# Patient Record
Sex: Female | Born: 1995 | Race: White | Hispanic: No | Marital: Single
Health system: Southern US, Community
[De-identification: ages and names within clinical notes are randomized; demographics above are authoritative.]

## PROBLEM LIST (undated history)

## (undated) DIAGNOSIS — Z9889 Other specified postprocedural states: Secondary | ICD-10-CM

## (undated) DIAGNOSIS — M8560 Other cyst of bone, unspecified site: Secondary | ICD-10-CM

## (undated) HISTORY — PX: SP BONE MARROW BX: HXRAD666

---
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
## 2018-06-07 IMAGING — DX DG KNEE COMPLETE 4+V*L*
4 series · 4 of 4 positions shown · non-contrast
Comparison: None.

CLINICAL DATA: Pain and stiffness for 3 days. Reported history of
bone cysts.

EXAM:
LEFT KNEE - COMPLETE 4+ VIEW

[knee ap]
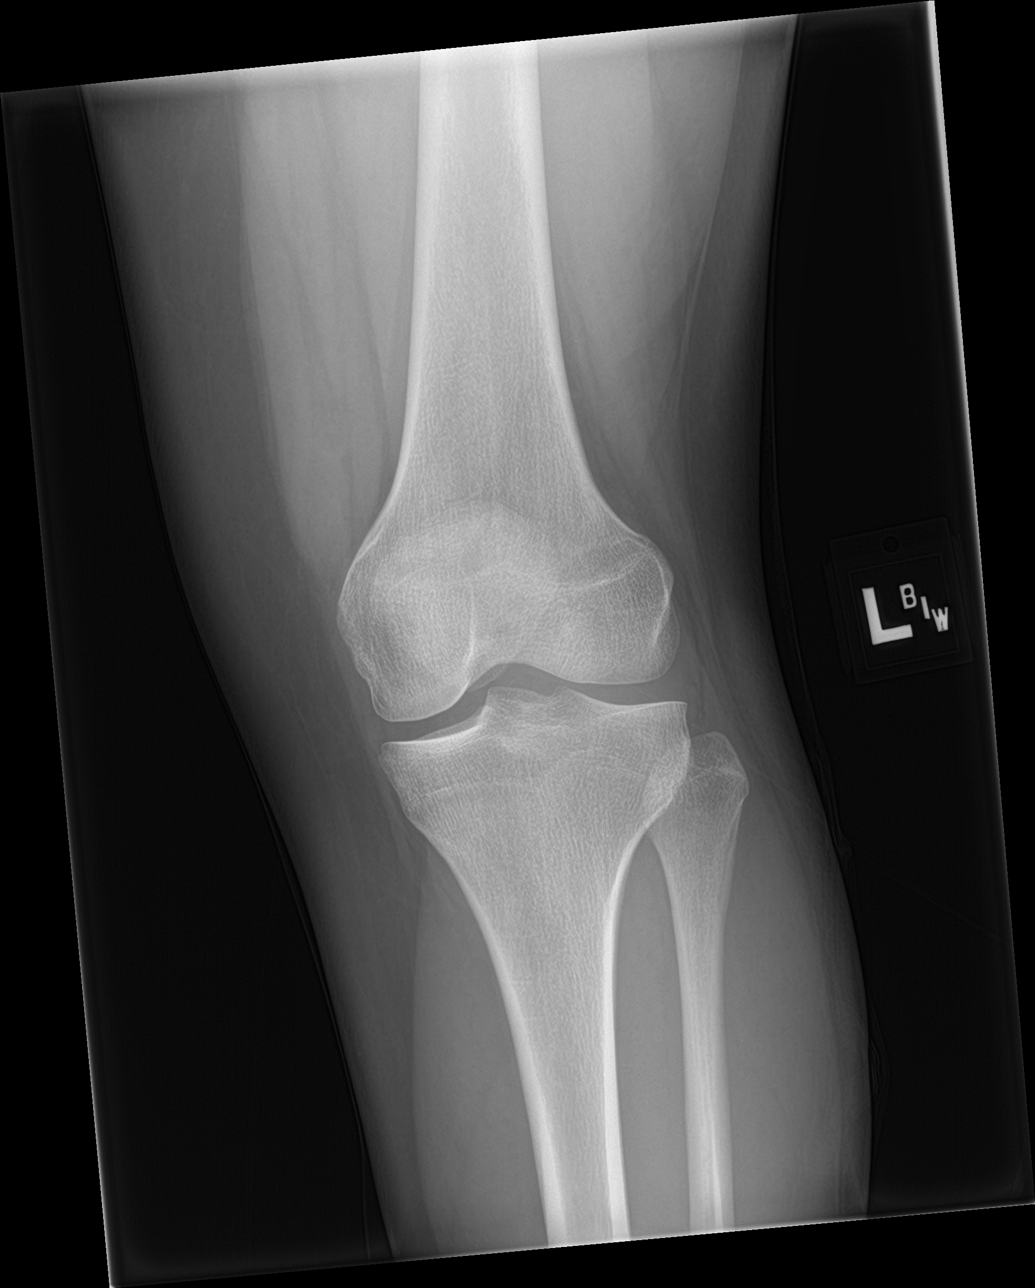

[knee lat]
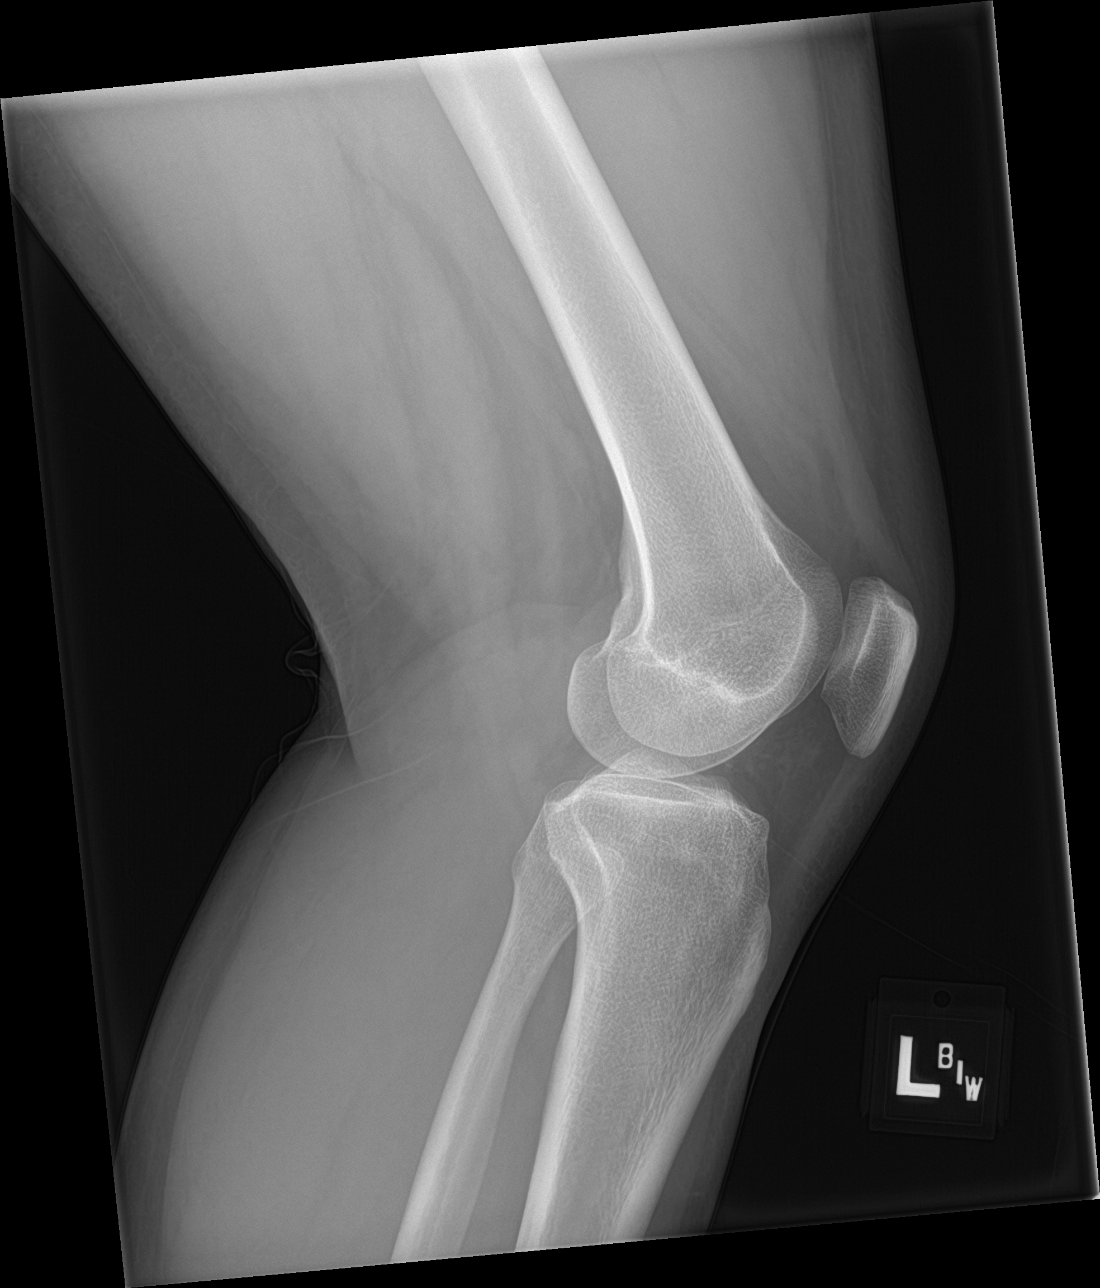

[knee obl (1 of 2)]
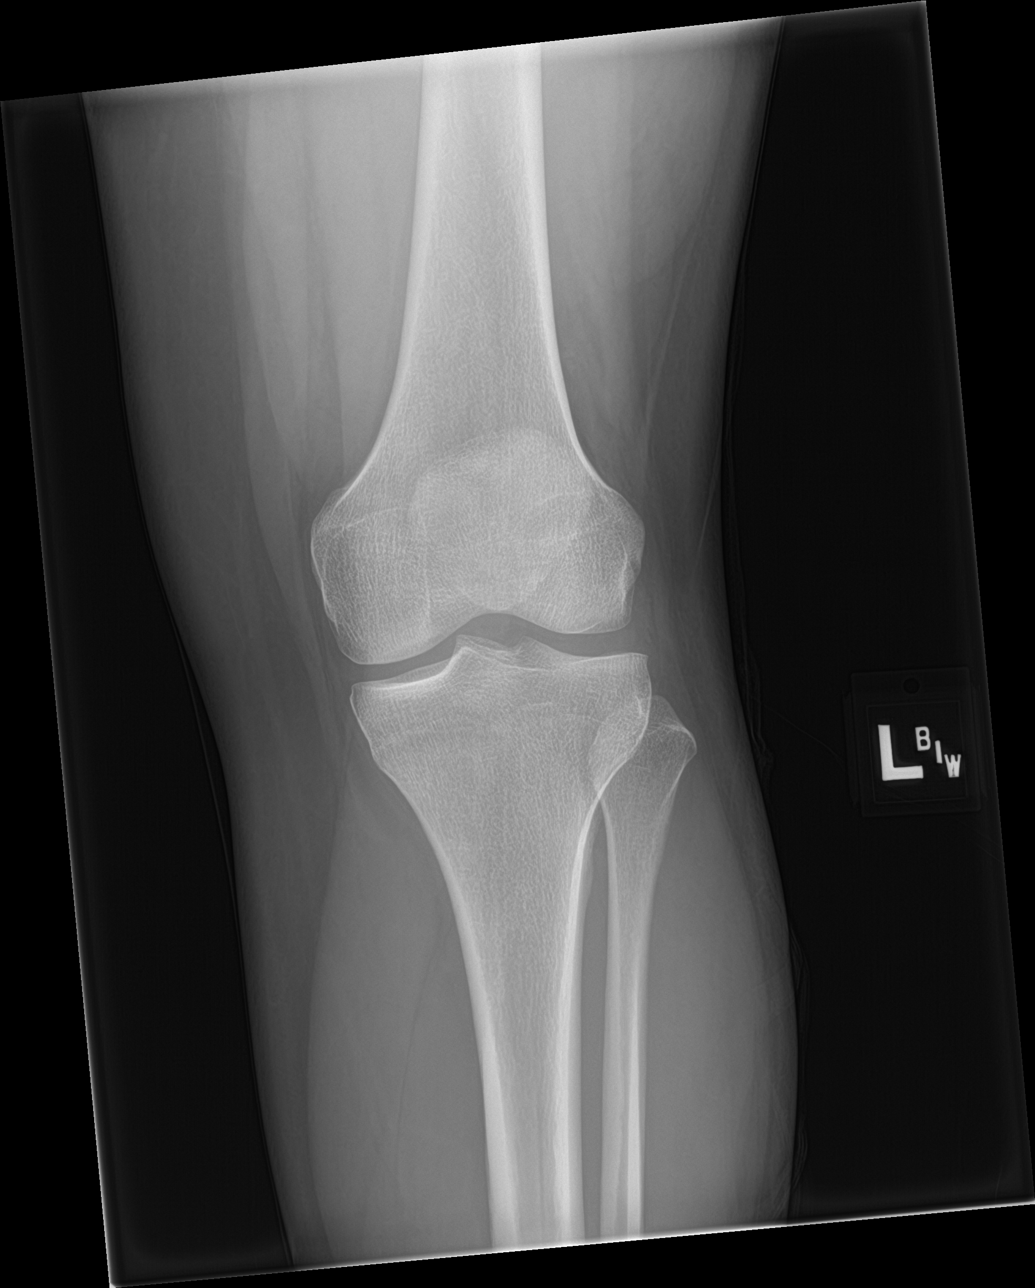

[knee obl (2 of 2)]
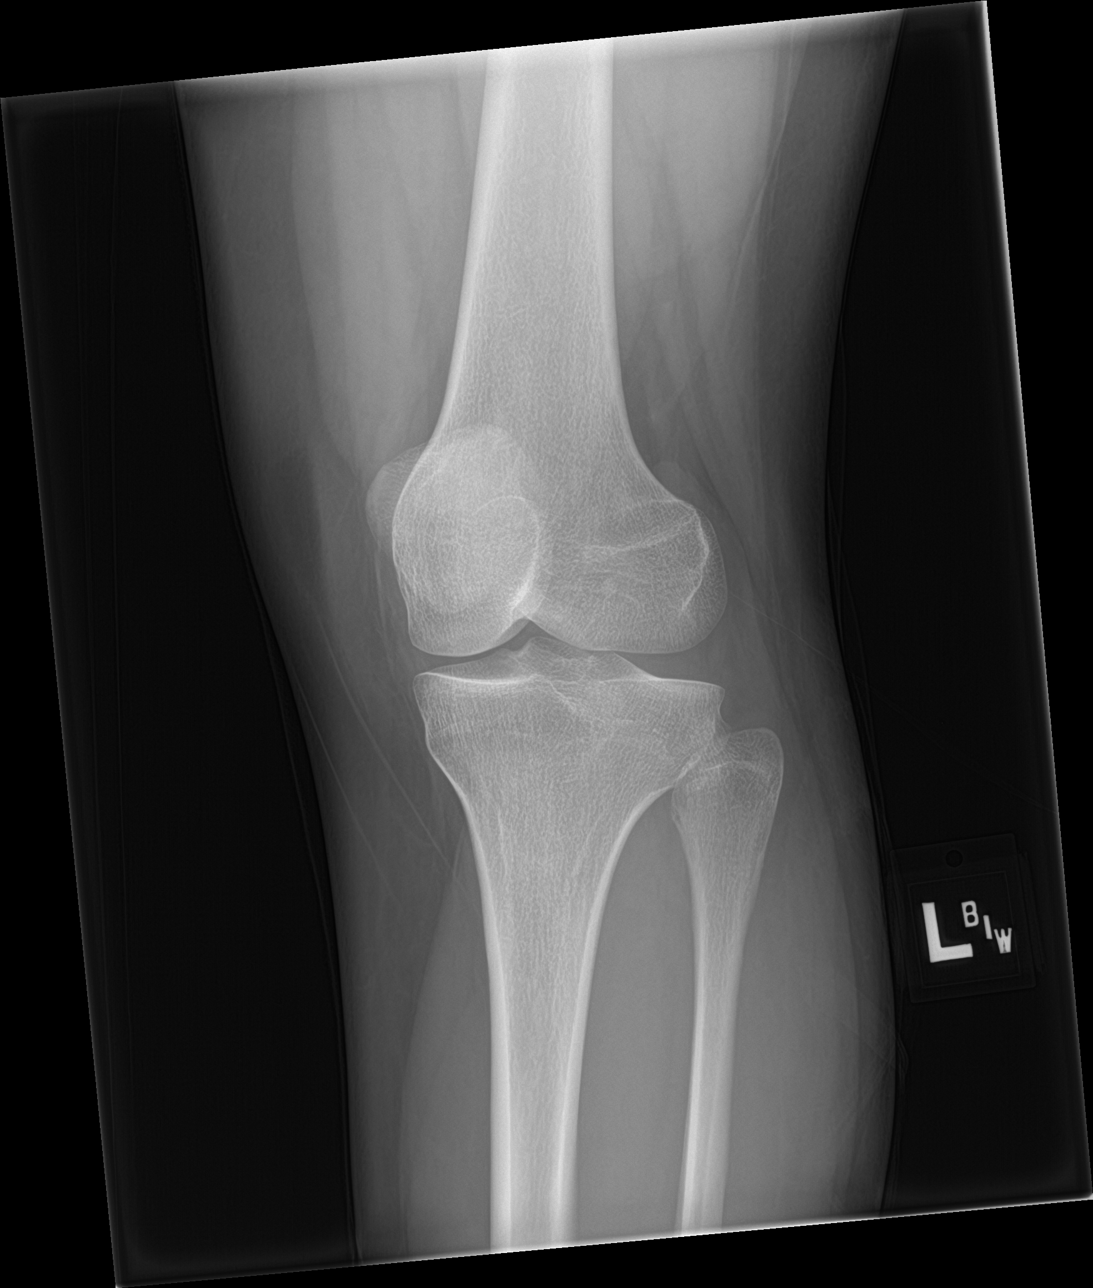

[4 of 4 positions shown; findings below may reference images not displayed]

FINDINGS: Frontal, lateral, and bilateral oblique views were obtained. There
is slight medial patellar subluxation. No fracture or dislocation.
The joint spaces appear normal. No erosive change. No joint
effusion. No cystic bone lesions evident.
IMPRESSION: Slight medial patellar subluxation. No fracture or dislocation. No
joint effusion. No apparent arthropathy. No cystic bone lesion is
evident.
